# Patient Record
Sex: Male | Born: 1935 | Race: White | Hispanic: No | Marital: Married
Health system: Southern US, Community
[De-identification: ages and names within clinical notes are randomized; demographics above are authoritative.]

---

## 2010-10-03 ENCOUNTER — Inpatient Hospital Stay: Payer: Self-pay | Admitting: *Deleted

## 2010-10-05 ENCOUNTER — Ambulatory Visit: Payer: Self-pay | Admitting: Internal Medicine

## 2010-11-05 ENCOUNTER — Ambulatory Visit: Payer: Self-pay | Admitting: Internal Medicine

## 2010-12-04 ENCOUNTER — Ambulatory Visit: Payer: Self-pay | Admitting: Internal Medicine

## 2011-01-04 ENCOUNTER — Ambulatory Visit: Payer: Self-pay | Admitting: Internal Medicine

## 2011-02-03 ENCOUNTER — Ambulatory Visit: Payer: Self-pay | Admitting: Internal Medicine

## 2011-03-06 ENCOUNTER — Ambulatory Visit: Payer: Self-pay | Admitting: Internal Medicine

## 2011-04-01 ENCOUNTER — Inpatient Hospital Stay: Payer: Self-pay | Admitting: Specialist

## 2011-04-05 ENCOUNTER — Ambulatory Visit: Payer: Self-pay | Admitting: Internal Medicine

## 2011-10-26 ENCOUNTER — Ambulatory Visit: Payer: Self-pay | Admitting: Internal Medicine

## 2011-10-26 LAB — CBC CANCER CENTER
Eosinophil #: 0.1 x10 3/mm (ref 0.0–0.7)
Lymphocyte %: 11.9 %
MCHC: 33.8 g/dL (ref 32.0–36.0)
MCV: 92 fL (ref 80–100)
Monocyte #: 0.7 x10 3/mm (ref 0.0–0.7)
Monocyte %: 6.6 %
Platelet: 304 x10 3/mm (ref 150–440)
RBC: 4.1 10*6/uL — ABNORMAL LOW (ref 4.40–5.90)
RDW: 17.1 % — ABNORMAL HIGH (ref 11.5–14.5)
WBC: 10.1 x10 3/mm (ref 3.8–10.6)

## 2011-10-26 LAB — CREATININE, SERUM
Creatinine: 1.63 mg/dL — ABNORMAL HIGH (ref 0.60–1.30)
EGFR (African American): 53 — ABNORMAL LOW
EGFR (Non-African Amer.): 44 — ABNORMAL LOW

## 2011-11-06 ENCOUNTER — Ambulatory Visit: Payer: Self-pay | Admitting: Internal Medicine

## 2012-02-10 IMAGING — CR DG CHEST 1V PORT
1 series · 1 of 1 positions shown · non-contrast
Comparison: none

REASON FOR EXAM: resp failure, post intubation
COMMENTS:

[view not recorded]
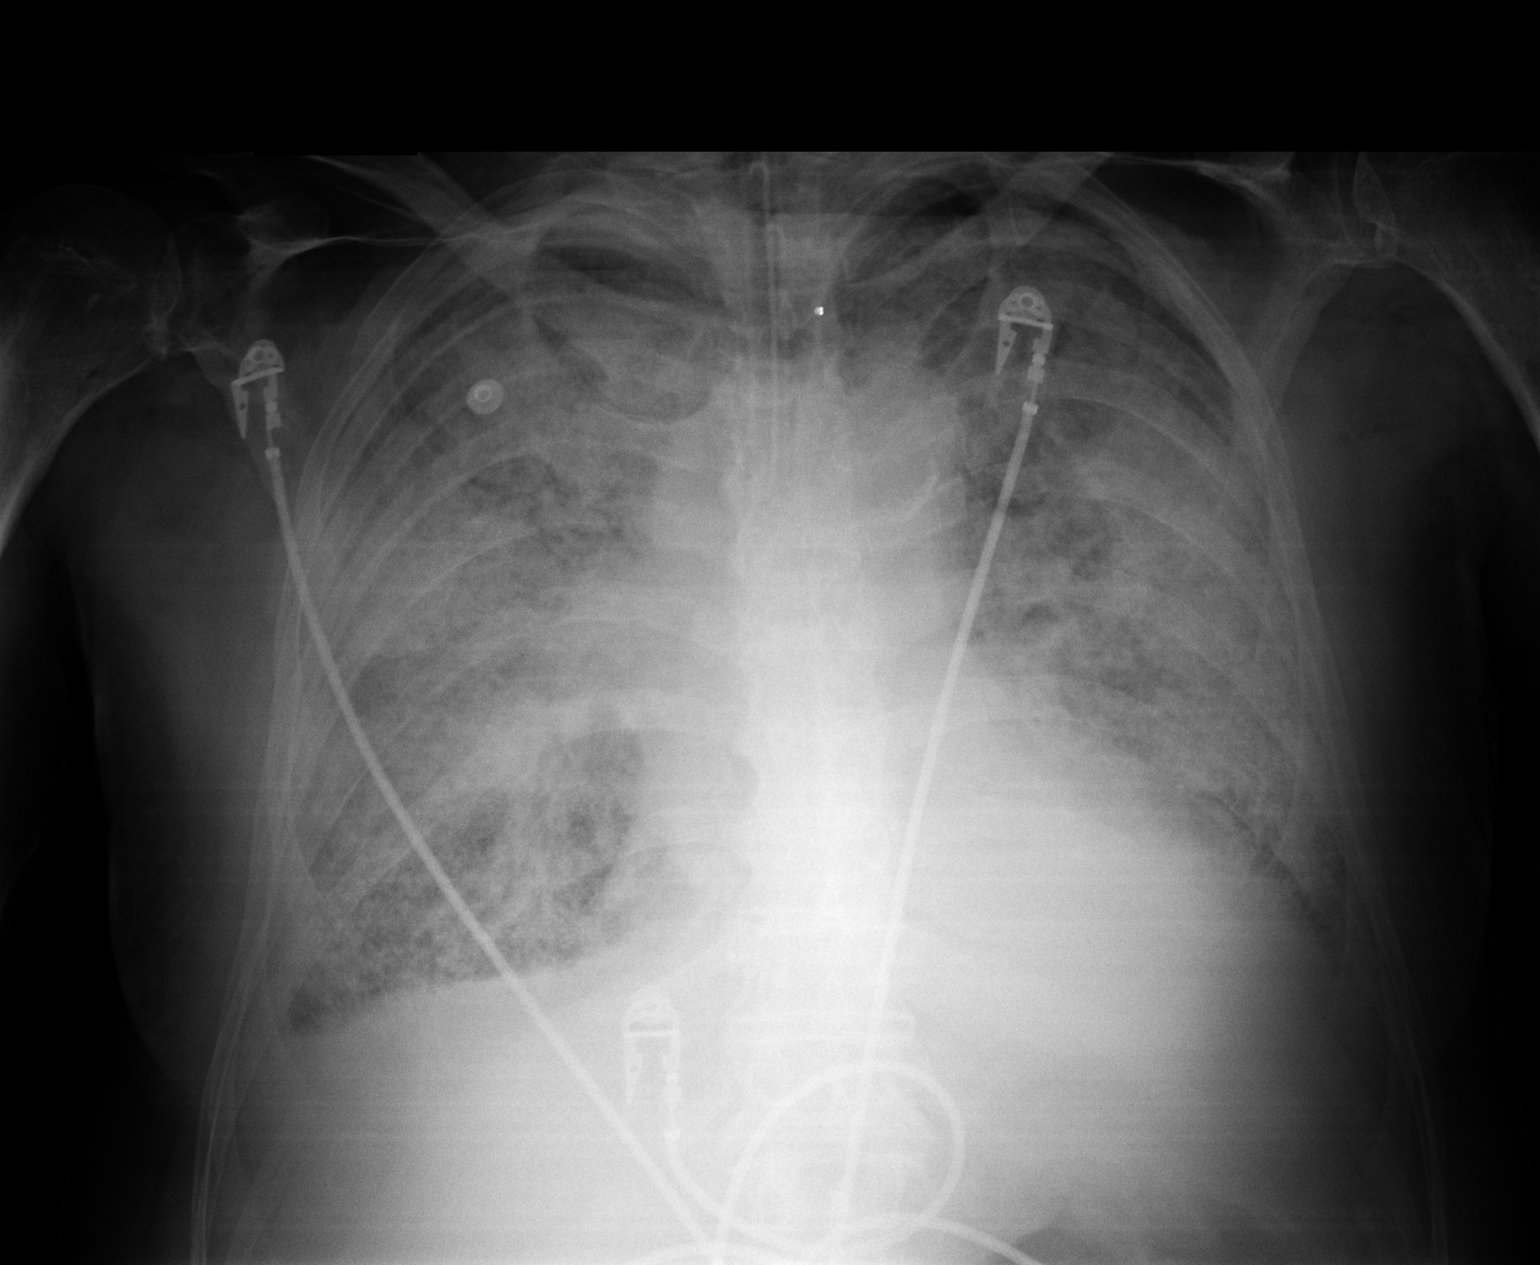

[1 of 1 positions shown; findings below may reference images not displayed]

PROCEDURE:     DXR - DXR PORTABLE CHEST SINGLE VIEW  - October 05, 2010  [DATE]

RESULT:     Comparison is made to prior study same date earlier time.

In the interim an endotracheal tube has been placed with tip projecting
approximately 1 cm below the level of the clavicles. This projects
approximately 1.5 cm above the carina. Diffuse bilateral pulmonary opacities
are once again appreciated unchanged when compared to previous study. The
cardiac silhouette is obscured. The visualized bony skeleton is unremarkable.
IMPRESSION: 1. Interval placement of an endotracheal tube which appears to be adequately
positioned. Continued surveillance evaluation recommended.
2. Diffuse bilateral pulmonary opacities concerning for bilateral
pneumonitis. Surveillance evaluation recommended.

## 2012-02-12 IMAGING — CR DG CHEST 1V PORT
1 series · 1 of 1 positions shown · non-contrast
Comparison: none

REASON FOR EXAM: pulm edema, on ventl
COMMENTS:

PROCEDURE:     DXR - DXR PORTABLE CHEST SINGLE VIEW  - October 07, 2010  [DATE]
RESULT:     Frontal view of the chest is performed. Comparison is made to a
prior study dated 10/06/2010.

[view not recorded]
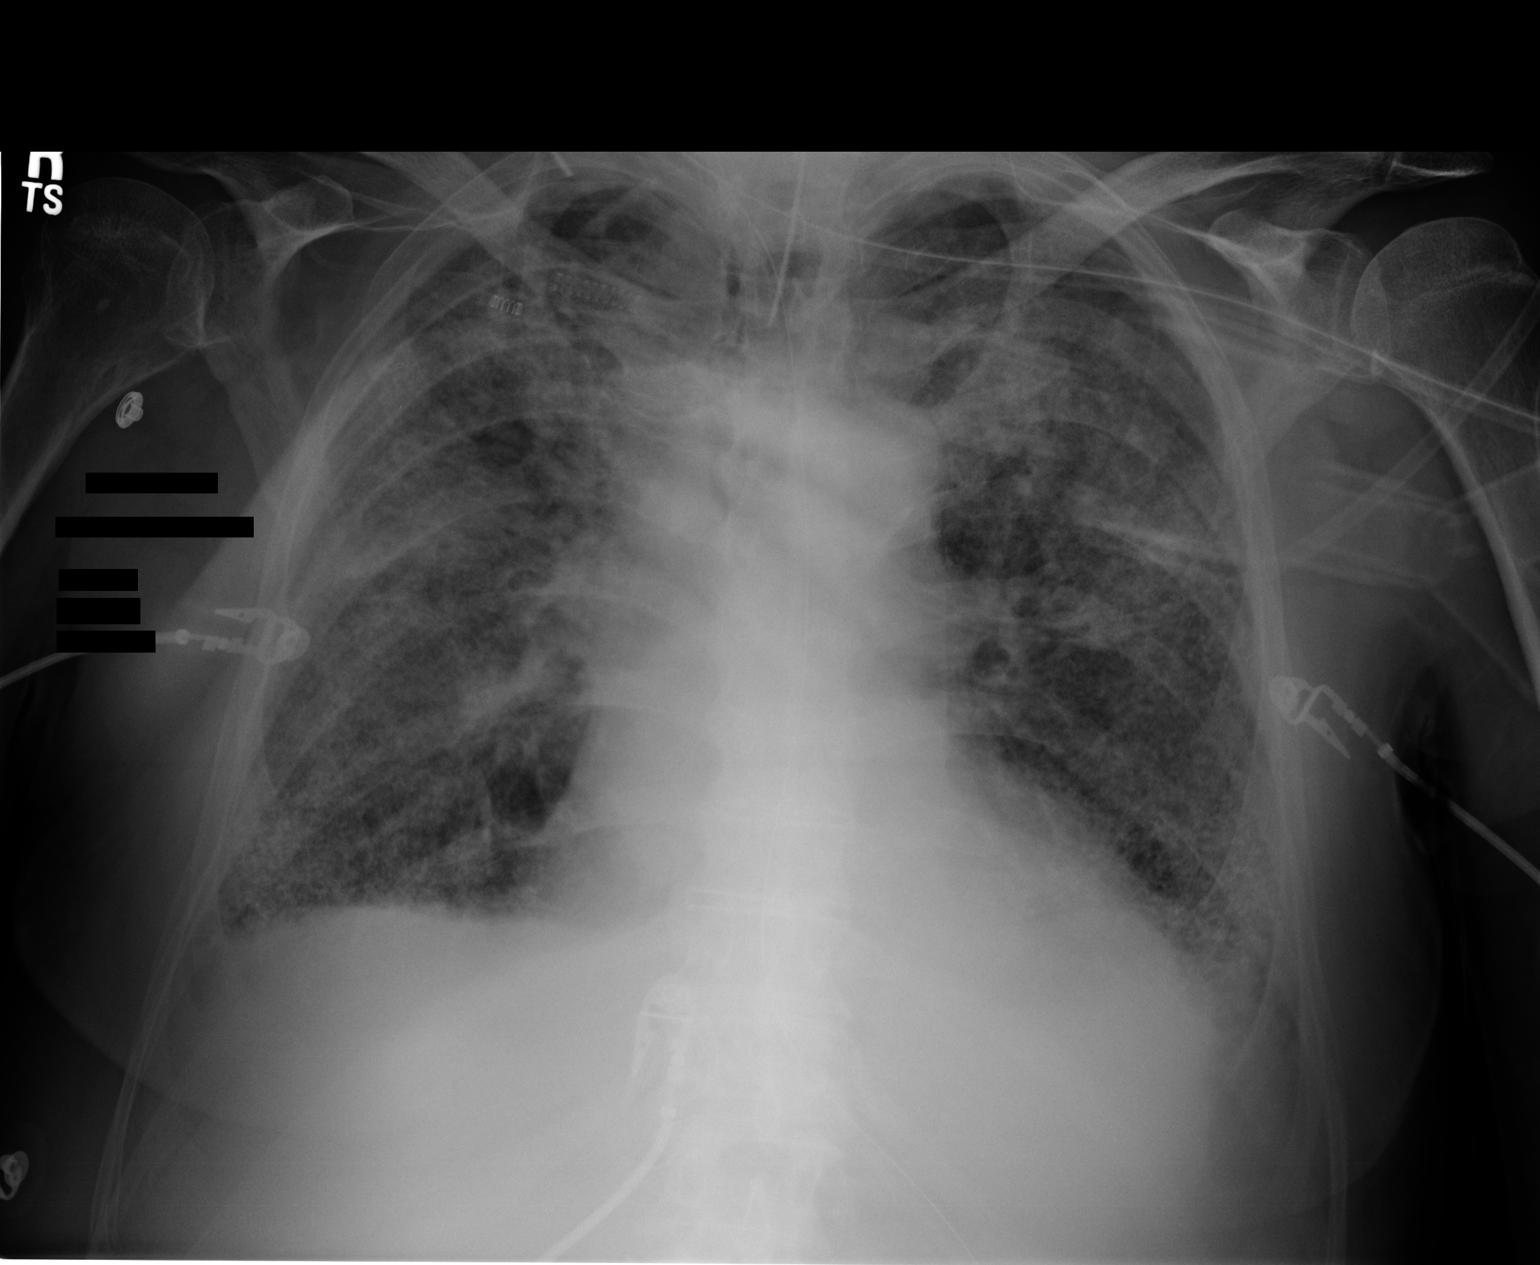

[1 of 1 positions shown; findings below may reference images not displayed]

FINDINGS: Endotracheal tube is appreciated with the tip at the level of the
clavicles. NG tube is seen with the tip not on the view of this study. The
bilateral pulmonary opacities have slightly decreased in conspicuity. There
is residual diffuse density within the right and left hemithoraces as well
as prominence and indistinctness of the interstitial markings and moderate
peribronchial cuffing. Blunting of the right costophrenic angle is
identified. The cardiac silhouette is enlarged. The visualized bony skeleton
is grossly unremarkable. No new focal regions of consolidation are
appreciated.
IMPRESSION: 1.  Slightly improved interstitial findings though residual pulmonary
opacities are identified. Continued surveillance evaluation is recommended.
These findings likely represent the sequela of improving pulmonary edema and
underlying nonedematous interstitial infiltrate and possibly even fibrotic
changes cannot be excluded.
2.  Support tubes which appear to be adequately positioned.

## 2012-02-13 IMAGING — CR DG CHEST 1V PORT
1 series · 1 of 1 positions shown · non-contrast
Comparison: none

REASON FOR EXAM: sob
COMMENTS:

PROCEDURE:     DXR - DXR PORTABLE CHEST SINGLE VIEW  - October 08, 2010  [DATE]
RESULT:     Comparison: 10/07/2010

[view not recorded]
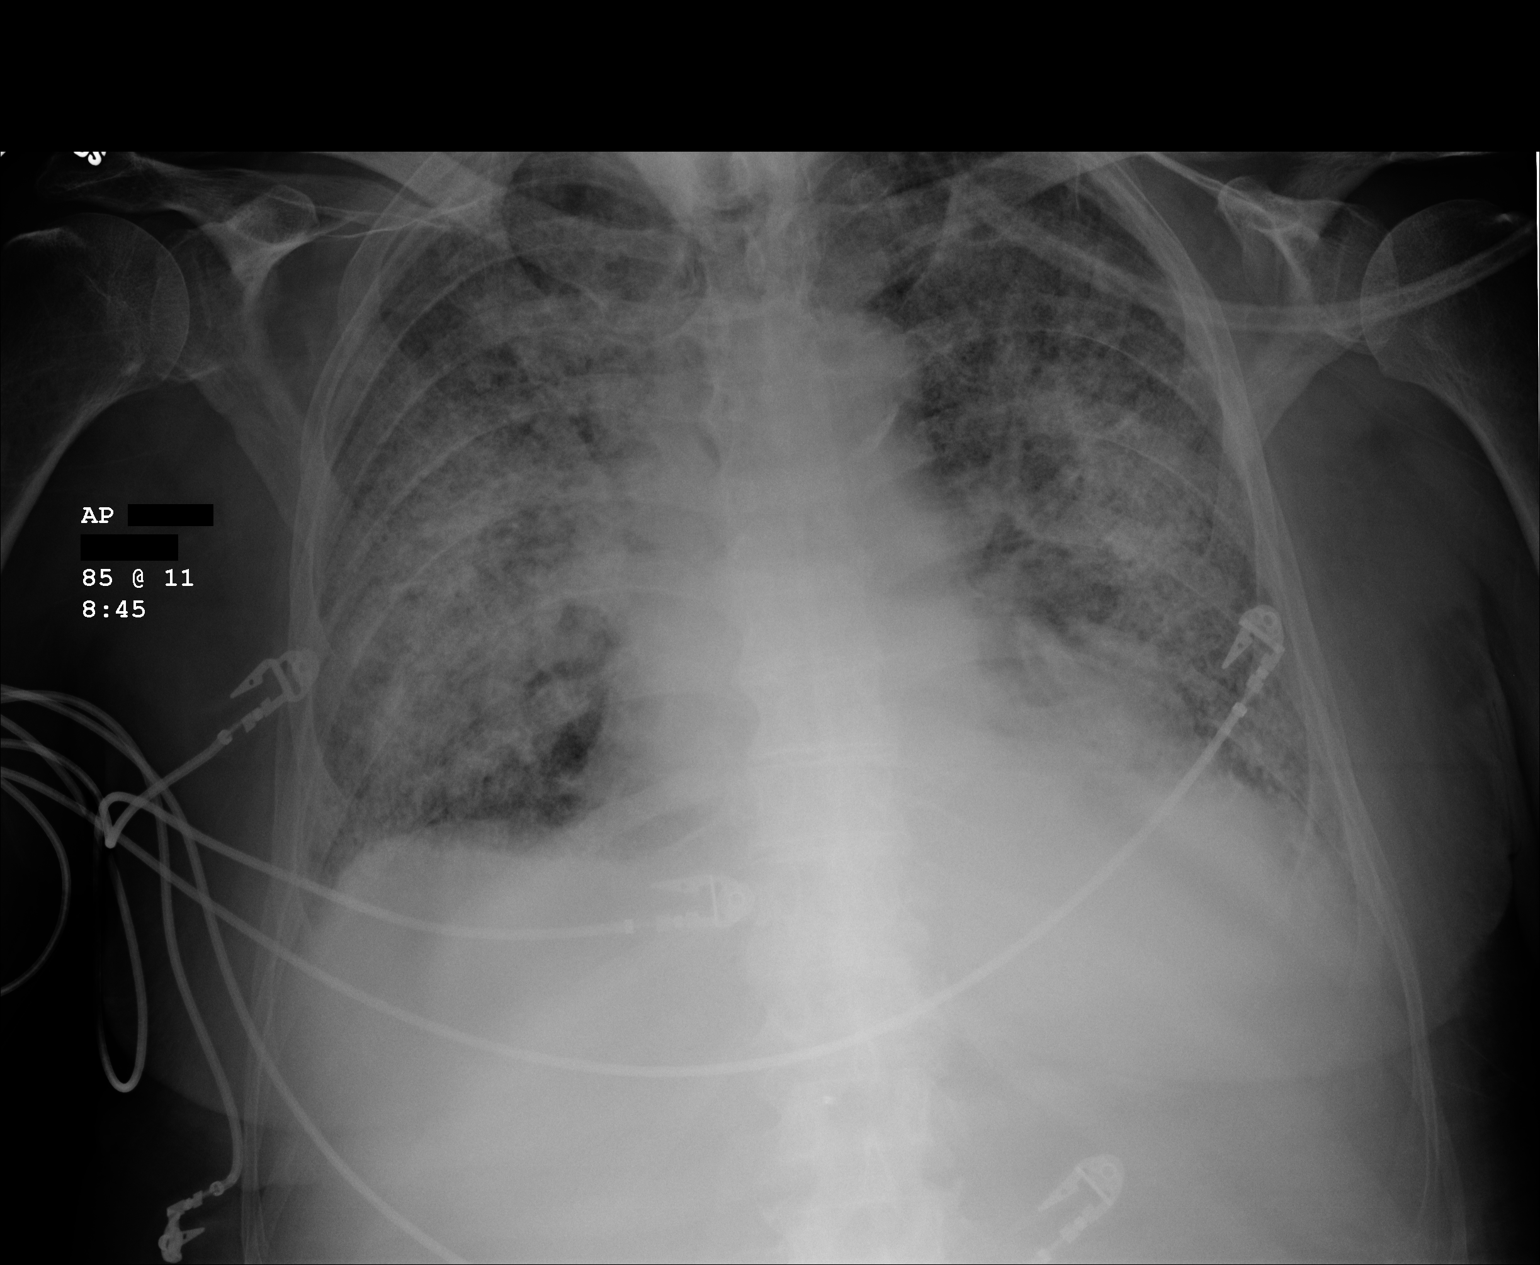

[1 of 1 positions shown; findings below may reference images not displayed]

FINDINGS: The endotracheal tube and enteric tube have been removed. Heart and
mediastinum are stable. Bilateral heterogeneous opacities throughout the
lungs are slightly increased from prior.
IMPRESSION: Slightly increase in diffuse bilateral heterogeneous opacities.

## 2012-02-18 IMAGING — CR DG CHEST 1V PORT
1 series · 1 of 1 positions shown · non-contrast
Comparison: none

REASON FOR EXAM: chf follow up
COMMENTS:

[view not recorded]
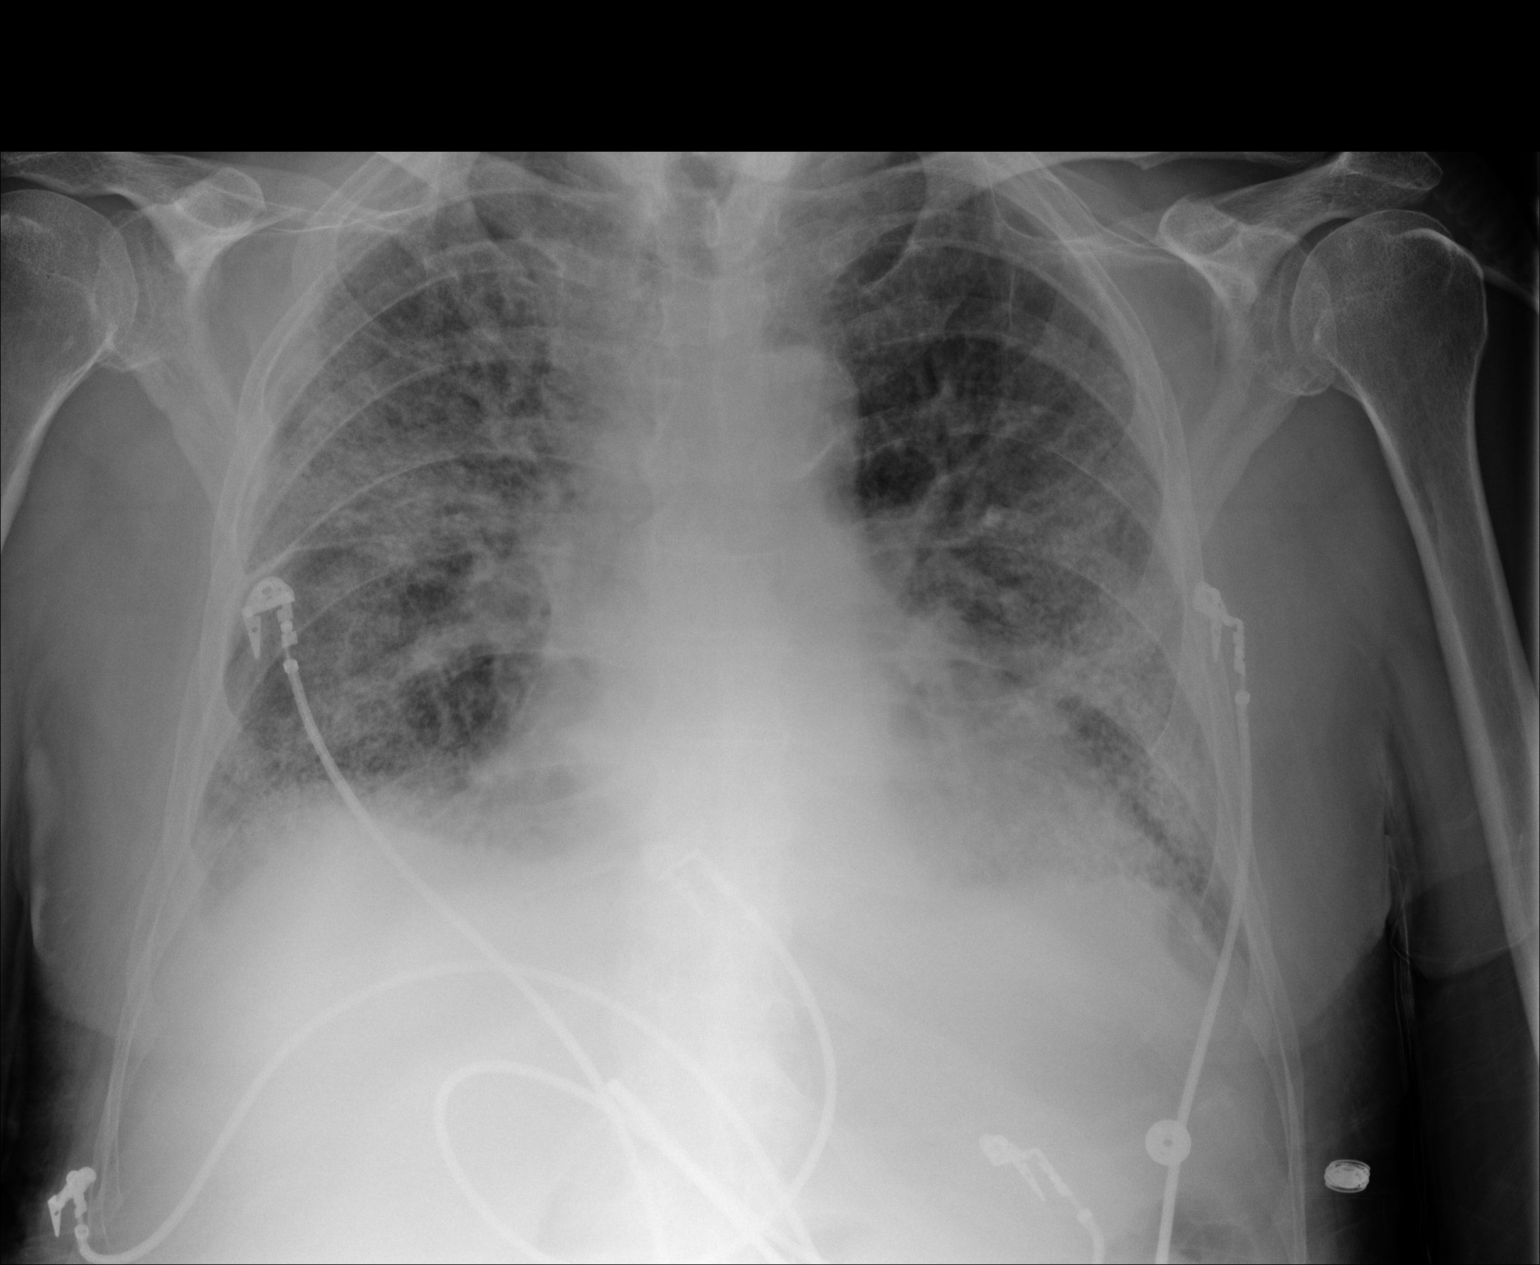

[1 of 1 positions shown; findings below may reference images not displayed]

PROCEDURE:     DXR - DXR PORTABLE CHEST SINGLE VIEW  - October 13, 2010  [DATE]

RESULT:     Comparison is made to the study of 10/09/2010. There is persistent
increased density in both lungs with pulmonary vascular congestion and
patchy bilateral predominantly interstitial infiltrates or edema increasing
at the left lung base compared to the previous study. The heart is
borderline to mildly enlarged. Trace pleural effusions are present.
Atherosclerotic calcification is demonstrated in the aorta. Monitoring
electrodes are present.
IMPRESSION: Persistent bilateral areas of edema or interstitial infiltrate. Followup to
document clearing is recommended.

## 2012-08-06 IMAGING — CR DG CHEST 2V
1 series · 2 of 2 positions shown · non-contrast
Comparison: none

REASON FOR EXAM: HYPOTENSION, LOW GRADE FEVER AND COUGH
COMMENTS:

[Series 1: view not recorded · 0.17mm/px · 2 of 2 slices shown]
[im 1/2]
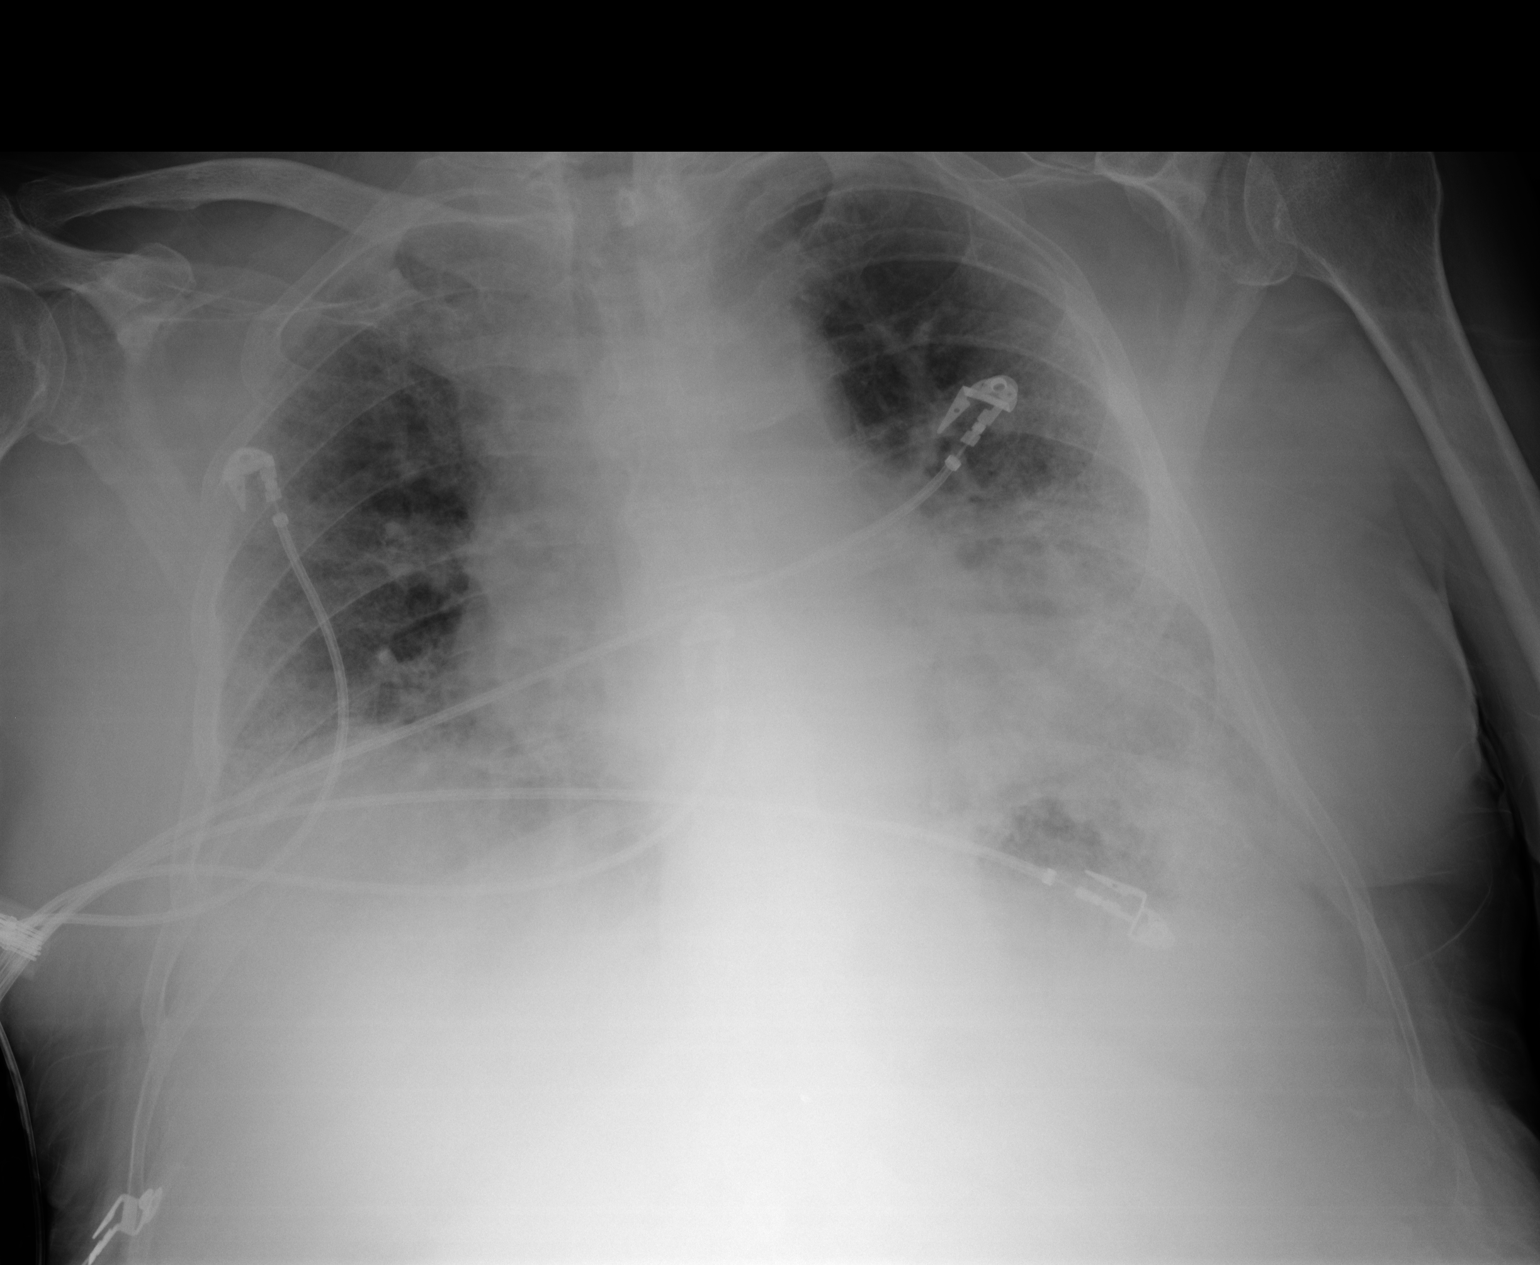
[im 2/2]
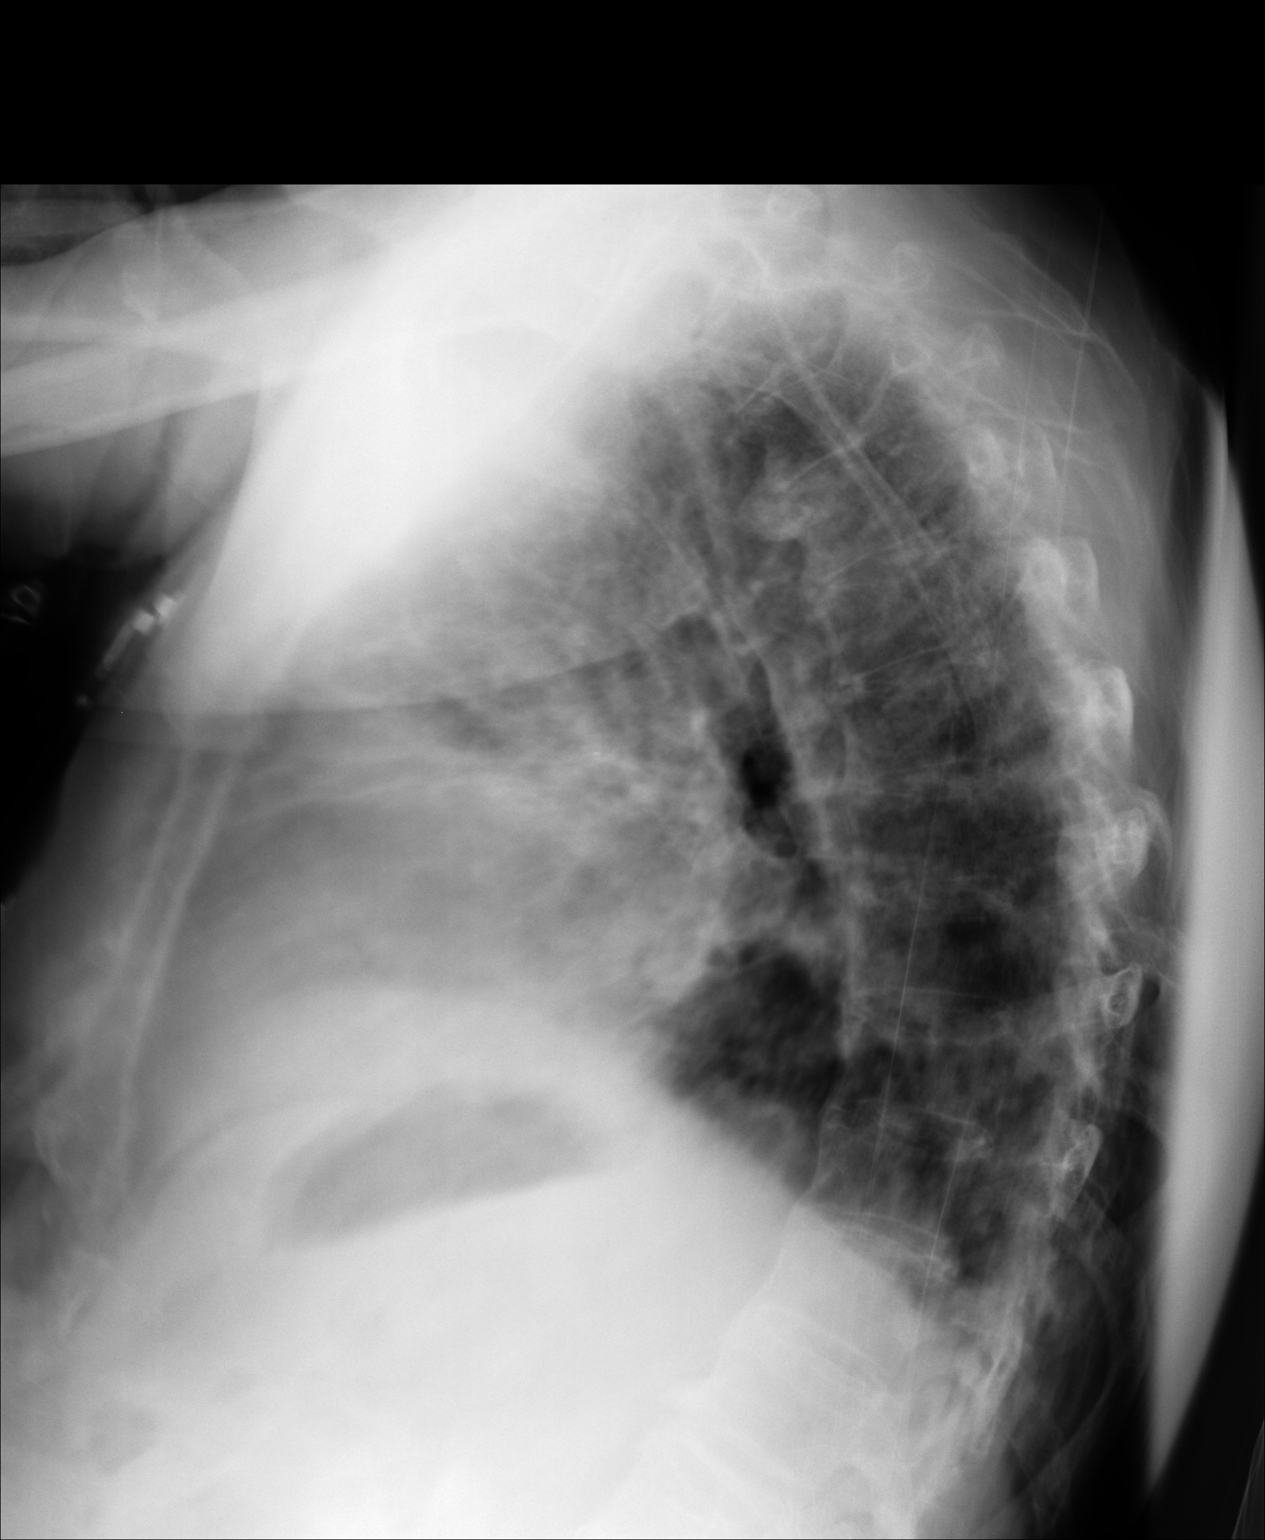

[2 of 2 positions shown; findings below may reference images not displayed]

PROCEDURE:     DXR - DXR CHEST PA (OR AP) AND LATERAL  - April 01, 2011 [DATE]

RESULT:     Comparison is made to the prior exam 10/18/2010. There is
increased density at the left base compatible with pneumonia or atelectasis.
The heart is upper limits for normal in size but appears stable as compared
to the prior exam. Monitoring electrodes are present.
IMPRESSION: There is increased density at the left base compatible with pneumonia or
atelectasis.

## 2012-10-05 ENCOUNTER — Ambulatory Visit: Payer: Self-pay | Admitting: Internal Medicine

## 2014-06-05 ENCOUNTER — Ambulatory Visit
Admit: 2014-06-05 | Disposition: A | Discharge status: Home / Self Care | Payer: Self-pay | Attending: Nurse Practitioner | Admitting: Nurse Practitioner

## 2014-08-06 ENCOUNTER — Ambulatory Visit: Payer: Self-pay | Admitting: Family Medicine

## 2014-08-17 ENCOUNTER — Ambulatory Visit: Payer: Self-pay

## 2014-08-17 LAB — COMPREHENSIVE METABOLIC PANEL
ALK PHOS: 46 U/L
ANION GAP: 10 (ref 7–16)
Albumin: 2 g/dL — ABNORMAL LOW (ref 3.4–5.0)
BILIRUBIN TOTAL: 0.3 mg/dL (ref 0.2–1.0)
BUN: 16 mg/dL (ref 7–18)
CO2: 28 mmol/L (ref 21–32)
CREATININE: 1.41 mg/dL — AB (ref 0.60–1.30)
Calcium, Total: 7.5 mg/dL — ABNORMAL LOW (ref 8.5–10.1)
Chloride: 109 mmol/L — ABNORMAL HIGH (ref 98–107)
EGFR (African American): >60
GFR CALC NON AF AMER: 51 — AB
GLUCOSE: 116 mg/dL — AB (ref 65–99)
OSMOLALITY: 295 (ref 275–301)
POTASSIUM: 3.8 mmol/L (ref 3.5–5.1)
SGOT(AST): 15 U/L (ref 15–37)
SGPT (ALT): 12 U/L — ABNORMAL LOW
Sodium: 147 mmol/L — ABNORMAL HIGH (ref 136–145)
TOTAL PROTEIN: 5.8 g/dL — AB (ref 6.4–8.2)

## 2014-08-17 LAB — CBC WITH DIFFERENTIAL/PLATELET
Basophil #: 0.1 10*3/uL (ref 0.0–0.1)
Basophil %: 0.5 %
EOS PCT: 7.4 %
Eosinophil #: 0.8 10*3/uL — ABNORMAL HIGH (ref 0.0–0.7)
HCT: 34.3 % — AB (ref 40.0–52.0)
HGB: 11.2 g/dL — AB (ref 13.0–18.0)
LYMPHS ABS: 1.3 10*3/uL (ref 1.0–3.6)
Lymphocyte %: 11.4 %
MCH: 32 pg (ref 26.0–34.0)
MCHC: 32.6 g/dL (ref 32.0–36.0)
MCV: 98 fL (ref 80–100)
Monocyte #: 1 x10 3/mm (ref 0.2–1.0)
Monocyte %: 9.2 %
Neutrophil #: 7.9 10*3/uL — ABNORMAL HIGH (ref 1.4–6.5)
Neutrophil %: 71.5 %
PLATELETS: 239 10*3/uL (ref 150–440)
RBC: 3.49 10*6/uL — ABNORMAL LOW (ref 4.40–5.90)
RDW: 15.7 % — ABNORMAL HIGH (ref 11.5–14.5)
WBC: 11.1 10*3/uL — AB (ref 3.8–10.6)

## 2014-08-18 ENCOUNTER — Ambulatory Visit: Payer: Self-pay

## 2014-08-18 ENCOUNTER — Ambulatory Visit: Payer: Self-pay | Admitting: Family Medicine

## 2014-08-19 LAB — PRO B NATRIURETIC PEPTIDE: B-TYPE NATIURETIC PEPTID: 4490 pg/mL — AB (ref 0–450)

## 2014-08-23 LAB — CULTURE, BLOOD (SINGLE)

## 2014-09-25 ENCOUNTER — Emergency Department: Payer: Self-pay | Admitting: Emergency Medicine

## 2014-09-25 LAB — URINALYSIS, COMPLETE
Bilirubin,UR: NEGATIVE
GLUCOSE, UR: NEGATIVE mg/dL (ref 0–75)
Ketone: NEGATIVE
Nitrite: NEGATIVE
Ph: 5 (ref 4.5–8.0)
Protein: 30
RBC,UR: 5 /HPF (ref 0–5)
SPECIFIC GRAVITY: 1.024 (ref 1.003–1.030)
WBC UR: 2 /HPF (ref 0–5)

## 2014-09-25 LAB — PROTIME-INR
INR: 1.6
Prothrombin Time: 18.9 secs — ABNORMAL HIGH (ref 11.5–14.7)

## 2014-09-25 LAB — COMPREHENSIVE METABOLIC PANEL
ALK PHOS: 43 U/L — AB
ALT: 115 U/L — AB
Albumin: 1.7 g/dL — ABNORMAL LOW (ref 3.4–5.0)
Anion Gap: 11 (ref 7–16)
BUN: 47 mg/dL — AB (ref 7–18)
Bilirubin,Total: 0.6 mg/dL (ref 0.2–1.0)
CHLORIDE: 124 mmol/L — AB (ref 98–107)
CREATININE: 3.12 mg/dL — AB (ref 0.60–1.30)
Calcium, Total: 6.3 mg/dL — CL (ref 8.5–10.1)
Co2: 15 mmol/L — ABNORMAL LOW (ref 21–32)
EGFR (African American): 25 — ABNORMAL LOW
EGFR (Non-African Amer.): 21 — ABNORMAL LOW
Glucose: 157 mg/dL — ABNORMAL HIGH (ref 65–99)
Osmolality: 314 (ref 275–301)
Potassium: 4 mmol/L (ref 3.5–5.1)
SGOT(AST): 373 U/L — ABNORMAL HIGH (ref 15–37)
Sodium: 150 mmol/L — ABNORMAL HIGH (ref 136–145)
Total Protein: 5 g/dL — ABNORMAL LOW (ref 6.4–8.2)

## 2014-09-25 LAB — CBC
HCT: 29 % — AB (ref 40.0–52.0)
HGB: 9 g/dL — AB (ref 13.0–18.0)
MCH: 31.9 pg (ref 26.0–34.0)
MCHC: 31.2 g/dL — AB (ref 32.0–36.0)
MCV: 102 fL — AB (ref 80–100)
Platelet: 169 10*3/uL (ref 150–440)
RBC: 2.83 10*6/uL — ABNORMAL LOW (ref 4.40–5.90)
RDW: 16.1 % — ABNORMAL HIGH (ref 11.5–14.5)
WBC: 13 10*3/uL — ABNORMAL HIGH (ref 3.8–10.6)

## 2014-09-25 LAB — PHOSPHORUS: Phosphorus: 2.8 mg/dL (ref 2.5–4.9)

## 2014-09-25 LAB — TROPONIN I: Troponin-I: >40 ng/mL

## 2014-09-25 LAB — MAGNESIUM: Magnesium: 1.7 mg/dL — ABNORMAL LOW

## 2014-09-29 LAB — URINE CULTURE

## 2014-10-05 DEATH — deceased

## 2015-01-04 ENCOUNTER — Ambulatory Visit: Payer: Self-pay | Admitting: Nurse Practitioner

## 2015-01-26 NOTE — Op Note (Signed)
PATIENT NAME:  Chad Cochran, Chad Cochran MR#:  [id number] DATE OF BIRTH:  09-19-1931  DATE OF PROCEDURE:  08/06/2014  PREOPERATIVE DIAGNOSES:   1.  Anemia. 2.  Monoclonal myopathy.  3.  End-stage renal disease.  4.  Resistant infection requiring IV antibiotics.  5.  Nonfunctional PICC line.   POSTOPERATIVE DIAGNOSES:  1.  Anemia. 2.  Monoclonal myopathy.  3.  End-stage renal disease.  4.  Resistant infection requiring IV antibiotics.  5.  Nonfunctional PICC line.   PROCEDURES:  1. Fluoroscopic guidance for placement of catheter.  2. Removal and replacement to same venous catheter, right arm, PICC line.  SURGEON:  Festus BarrenJason Hildagard Sobecki, MD  ANESTHESIA: Local.   ESTIMATED BLOOD LOSS: Minimal.   INDICATION FOR PROCEDURE: An 81 year old gentleman referred from his nursing home for an exchange of his PICC line as his was poorly functioning.   DESCRIPTION OF PROCEDURE: The patient's existing catheter right arm was sterilely prepped and draped, and a sterile surgical field was created. The existing catheter was rewired and removed.  We then used a single-lumen 30 PICC line, placed this over the wire and secured at the skin at 32 cm parking the catheter tip in the distal superior vena cava.  It withdrew blood well and flushed easily with heparinized saline.  The patient tolerated procedure well.         ____________________________ Annice NeedyJason S. Silvia Hightower, MD jsd:DT D: 08/09/2014 17:16:20 ET T: 08/09/2014 18:07:26 ET JOB#: [id number]  cc: Annice NeedyJason S. Afra Tricarico, MD, <Dictator> Annice NeedyJASON S Gurinder Toral MD ELECTRONICALLY SIGNED 08/20/2014 10:10

## 2020-04-23 NOTE — Progress Notes (Signed)
This encounter was created in error - please disregard.
